# Patient Record
Sex: Female | Born: 1985 | Race: Black or African American | Hispanic: No | Marital: Single | Smoking: Never smoker
Health system: Southern US, Community
[De-identification: ages and names within clinical notes are randomized; demographics above are authoritative.]

## PROBLEM LIST (undated history)

## (undated) DIAGNOSIS — L309 Dermatitis, unspecified: Secondary | ICD-10-CM

## (undated) HISTORY — DX: Dermatitis, unspecified: L30.9

---

## 2016-09-01 ENCOUNTER — Other Ambulatory Visit: Payer: Self-pay | Admitting: Physician Assistant

## 2016-09-01 DIAGNOSIS — N946 Dysmenorrhea, unspecified: Secondary | ICD-10-CM

## 2016-09-01 DIAGNOSIS — R630 Anorexia: Secondary | ICD-10-CM

## 2016-09-05 ENCOUNTER — Other Ambulatory Visit: Payer: Self-pay

## 2016-09-08 ENCOUNTER — Ambulatory Visit
Admission: RE | Admit: 2016-09-08 | Discharge: 2016-09-08 | Disposition: A | Discharge status: Home / Self Care | Payer: Medicaid Other | Source: Ambulatory Visit | Attending: Physician Assistant | Admitting: Physician Assistant

## 2016-09-08 DIAGNOSIS — R630 Anorexia: Secondary | ICD-10-CM

## 2016-09-08 DIAGNOSIS — N946 Dysmenorrhea, unspecified: Secondary | ICD-10-CM

## 2016-09-25 DIAGNOSIS — K219 Gastro-esophageal reflux disease without esophagitis: Secondary | ICD-10-CM | POA: Diagnosis not present

## 2016-09-25 DIAGNOSIS — J3089 Other allergic rhinitis: Secondary | ICD-10-CM | POA: Diagnosis not present

## 2016-09-25 DIAGNOSIS — E785 Hyperlipidemia, unspecified: Secondary | ICD-10-CM | POA: Diagnosis not present

## 2016-09-25 DIAGNOSIS — R1013 Epigastric pain: Secondary | ICD-10-CM | POA: Diagnosis not present

## 2016-09-25 DIAGNOSIS — E559 Vitamin D deficiency, unspecified: Secondary | ICD-10-CM | POA: Diagnosis not present

## 2016-09-25 DIAGNOSIS — I10 Essential (primary) hypertension: Secondary | ICD-10-CM | POA: Diagnosis not present

## 2016-09-25 DIAGNOSIS — N946 Dysmenorrhea, unspecified: Secondary | ICD-10-CM | POA: Diagnosis not present

## 2016-10-01 DIAGNOSIS — R110 Nausea: Secondary | ICD-10-CM | POA: Diagnosis not present

## 2016-10-01 DIAGNOSIS — R51 Headache: Secondary | ICD-10-CM | POA: Diagnosis not present

## 2016-10-01 DIAGNOSIS — Z30011 Encounter for initial prescription of contraceptive pills: Secondary | ICD-10-CM | POA: Diagnosis not present

## 2016-10-02 DIAGNOSIS — L298 Other pruritus: Secondary | ICD-10-CM | POA: Diagnosis not present

## 2016-11-20 DIAGNOSIS — J3089 Other allergic rhinitis: Secondary | ICD-10-CM | POA: Diagnosis not present

## 2016-11-20 DIAGNOSIS — K219 Gastro-esophageal reflux disease without esophagitis: Secondary | ICD-10-CM | POA: Diagnosis not present

## 2016-11-20 DIAGNOSIS — E559 Vitamin D deficiency, unspecified: Secondary | ICD-10-CM | POA: Diagnosis not present

## 2016-11-20 DIAGNOSIS — I10 Essential (primary) hypertension: Secondary | ICD-10-CM | POA: Diagnosis not present

## 2016-11-20 DIAGNOSIS — E785 Hyperlipidemia, unspecified: Secondary | ICD-10-CM | POA: Diagnosis not present

## 2016-11-20 DIAGNOSIS — R1013 Epigastric pain: Secondary | ICD-10-CM | POA: Diagnosis not present

## 2016-11-20 DIAGNOSIS — N946 Dysmenorrhea, unspecified: Secondary | ICD-10-CM | POA: Diagnosis not present

## 2016-11-20 DIAGNOSIS — Z124 Encounter for screening for malignant neoplasm of cervix: Secondary | ICD-10-CM | POA: Diagnosis not present

## 2017-01-13 DIAGNOSIS — R3989 Other symptoms and signs involving the genitourinary system: Secondary | ICD-10-CM | POA: Diagnosis not present

## 2017-01-13 DIAGNOSIS — N760 Acute vaginitis: Secondary | ICD-10-CM | POA: Diagnosis not present

## 2017-01-13 DIAGNOSIS — B9689 Other specified bacterial agents as the cause of diseases classified elsewhere: Secondary | ICD-10-CM | POA: Diagnosis not present

## 2017-07-30 DIAGNOSIS — N898 Other specified noninflammatory disorders of vagina: Secondary | ICD-10-CM | POA: Diagnosis not present

## 2017-07-30 DIAGNOSIS — N760 Acute vaginitis: Secondary | ICD-10-CM | POA: Diagnosis not present

## 2017-07-30 DIAGNOSIS — B9689 Other specified bacterial agents as the cause of diseases classified elsewhere: Secondary | ICD-10-CM | POA: Diagnosis not present

## 2017-08-18 DIAGNOSIS — R1013 Epigastric pain: Secondary | ICD-10-CM | POA: Diagnosis not present

## 2017-08-18 DIAGNOSIS — E785 Hyperlipidemia, unspecified: Secondary | ICD-10-CM | POA: Diagnosis not present

## 2017-08-18 DIAGNOSIS — I10 Essential (primary) hypertension: Secondary | ICD-10-CM | POA: Diagnosis not present

## 2017-08-18 DIAGNOSIS — E559 Vitamin D deficiency, unspecified: Secondary | ICD-10-CM | POA: Diagnosis not present

## 2017-08-18 DIAGNOSIS — N946 Dysmenorrhea, unspecified: Secondary | ICD-10-CM | POA: Diagnosis not present

## 2017-08-18 DIAGNOSIS — N760 Acute vaginitis: Secondary | ICD-10-CM | POA: Diagnosis not present

## 2017-08-18 DIAGNOSIS — K219 Gastro-esophageal reflux disease without esophagitis: Secondary | ICD-10-CM | POA: Diagnosis not present

## 2017-08-18 DIAGNOSIS — J3089 Other allergic rhinitis: Secondary | ICD-10-CM | POA: Diagnosis not present

## 2017-08-18 DIAGNOSIS — Z0000 Encounter for general adult medical examination without abnormal findings: Secondary | ICD-10-CM | POA: Diagnosis not present

## 2017-10-08 DIAGNOSIS — E785 Hyperlipidemia, unspecified: Secondary | ICD-10-CM | POA: Diagnosis not present

## 2017-10-08 DIAGNOSIS — M25471 Effusion, right ankle: Secondary | ICD-10-CM | POA: Diagnosis not present

## 2017-10-08 DIAGNOSIS — N946 Dysmenorrhea, unspecified: Secondary | ICD-10-CM | POA: Diagnosis not present

## 2017-10-08 DIAGNOSIS — Z131 Encounter for screening for diabetes mellitus: Secondary | ICD-10-CM | POA: Diagnosis not present

## 2017-10-08 DIAGNOSIS — N760 Acute vaginitis: Secondary | ICD-10-CM | POA: Diagnosis not present

## 2017-10-08 DIAGNOSIS — K219 Gastro-esophageal reflux disease without esophagitis: Secondary | ICD-10-CM | POA: Diagnosis not present

## 2017-10-08 DIAGNOSIS — R1013 Epigastric pain: Secondary | ICD-10-CM | POA: Diagnosis not present

## 2017-10-08 DIAGNOSIS — J3089 Other allergic rhinitis: Secondary | ICD-10-CM | POA: Diagnosis not present

## 2017-10-08 DIAGNOSIS — I10 Essential (primary) hypertension: Secondary | ICD-10-CM | POA: Diagnosis not present

## 2017-10-08 DIAGNOSIS — E559 Vitamin D deficiency, unspecified: Secondary | ICD-10-CM | POA: Diagnosis not present

## 2017-10-19 DIAGNOSIS — M2142 Flat foot [pes planus] (acquired), left foot: Secondary | ICD-10-CM | POA: Diagnosis not present

## 2017-10-19 DIAGNOSIS — M2141 Flat foot [pes planus] (acquired), right foot: Secondary | ICD-10-CM | POA: Diagnosis not present

## 2017-10-26 DIAGNOSIS — M419 Scoliosis, unspecified: Secondary | ICD-10-CM | POA: Diagnosis not present

## 2017-11-03 DIAGNOSIS — N898 Other specified noninflammatory disorders of vagina: Secondary | ICD-10-CM | POA: Diagnosis not present

## 2017-11-03 DIAGNOSIS — E785 Hyperlipidemia, unspecified: Secondary | ICD-10-CM | POA: Diagnosis not present

## 2017-11-03 DIAGNOSIS — I10 Essential (primary) hypertension: Secondary | ICD-10-CM | POA: Diagnosis not present

## 2017-11-03 DIAGNOSIS — N946 Dysmenorrhea, unspecified: Secondary | ICD-10-CM | POA: Diagnosis not present

## 2017-11-03 DIAGNOSIS — K219 Gastro-esophageal reflux disease without esophagitis: Secondary | ICD-10-CM | POA: Diagnosis not present

## 2017-11-03 DIAGNOSIS — J3089 Other allergic rhinitis: Secondary | ICD-10-CM | POA: Diagnosis not present

## 2017-11-03 DIAGNOSIS — E559 Vitamin D deficiency, unspecified: Secondary | ICD-10-CM | POA: Diagnosis not present

## 2017-11-03 DIAGNOSIS — R1013 Epigastric pain: Secondary | ICD-10-CM | POA: Diagnosis not present

## 2017-12-28 DIAGNOSIS — Z01118 Encounter for examination of ears and hearing with other abnormal findings: Secondary | ICD-10-CM | POA: Diagnosis not present

## 2017-12-28 DIAGNOSIS — I10 Essential (primary) hypertension: Secondary | ICD-10-CM | POA: Diagnosis not present

## 2017-12-28 DIAGNOSIS — R1013 Epigastric pain: Secondary | ICD-10-CM | POA: Diagnosis not present

## 2017-12-28 DIAGNOSIS — E785 Hyperlipidemia, unspecified: Secondary | ICD-10-CM | POA: Diagnosis not present

## 2017-12-28 DIAGNOSIS — K219 Gastro-esophageal reflux disease without esophagitis: Secondary | ICD-10-CM | POA: Diagnosis not present

## 2017-12-28 DIAGNOSIS — N946 Dysmenorrhea, unspecified: Secondary | ICD-10-CM | POA: Diagnosis not present

## 2017-12-28 DIAGNOSIS — J3089 Other allergic rhinitis: Secondary | ICD-10-CM | POA: Diagnosis not present

## 2017-12-28 DIAGNOSIS — Z136 Encounter for screening for cardiovascular disorders: Secondary | ICD-10-CM | POA: Diagnosis not present

## 2017-12-28 DIAGNOSIS — J029 Acute pharyngitis, unspecified: Secondary | ICD-10-CM | POA: Diagnosis not present

## 2017-12-28 DIAGNOSIS — Z131 Encounter for screening for diabetes mellitus: Secondary | ICD-10-CM | POA: Diagnosis not present

## 2017-12-28 DIAGNOSIS — Z5181 Encounter for therapeutic drug level monitoring: Secondary | ICD-10-CM | POA: Diagnosis not present

## 2017-12-28 DIAGNOSIS — E559 Vitamin D deficiency, unspecified: Secondary | ICD-10-CM | POA: Diagnosis not present

## 2018-02-09 DIAGNOSIS — N946 Dysmenorrhea, unspecified: Secondary | ICD-10-CM | POA: Diagnosis not present

## 2018-02-09 DIAGNOSIS — I10 Essential (primary) hypertension: Secondary | ICD-10-CM | POA: Diagnosis not present

## 2018-02-09 DIAGNOSIS — T7840XA Allergy, unspecified, initial encounter: Secondary | ICD-10-CM | POA: Diagnosis not present

## 2018-02-09 DIAGNOSIS — K219 Gastro-esophageal reflux disease without esophagitis: Secondary | ICD-10-CM | POA: Diagnosis not present

## 2018-02-09 DIAGNOSIS — E559 Vitamin D deficiency, unspecified: Secondary | ICD-10-CM | POA: Diagnosis not present

## 2018-02-09 DIAGNOSIS — E785 Hyperlipidemia, unspecified: Secondary | ICD-10-CM | POA: Diagnosis not present

## 2018-02-09 DIAGNOSIS — J3089 Other allergic rhinitis: Secondary | ICD-10-CM | POA: Diagnosis not present

## 2018-02-09 DIAGNOSIS — N898 Other specified noninflammatory disorders of vagina: Secondary | ICD-10-CM | POA: Diagnosis not present

## 2018-02-09 DIAGNOSIS — R1013 Epigastric pain: Secondary | ICD-10-CM | POA: Diagnosis not present

## 2018-02-16 DIAGNOSIS — I10 Essential (primary) hypertension: Secondary | ICD-10-CM | POA: Diagnosis not present

## 2018-02-16 DIAGNOSIS — J3089 Other allergic rhinitis: Secondary | ICD-10-CM | POA: Diagnosis not present

## 2018-02-16 DIAGNOSIS — N946 Dysmenorrhea, unspecified: Secondary | ICD-10-CM | POA: Diagnosis not present

## 2018-02-16 DIAGNOSIS — E785 Hyperlipidemia, unspecified: Secondary | ICD-10-CM | POA: Diagnosis not present

## 2018-02-16 DIAGNOSIS — R1013 Epigastric pain: Secondary | ICD-10-CM | POA: Diagnosis not present

## 2018-02-16 DIAGNOSIS — K219 Gastro-esophageal reflux disease without esophagitis: Secondary | ICD-10-CM | POA: Diagnosis not present

## 2018-02-16 DIAGNOSIS — N898 Other specified noninflammatory disorders of vagina: Secondary | ICD-10-CM | POA: Diagnosis not present

## 2018-02-16 DIAGNOSIS — E559 Vitamin D deficiency, unspecified: Secondary | ICD-10-CM | POA: Diagnosis not present

## 2018-07-27 DIAGNOSIS — R1013 Epigastric pain: Secondary | ICD-10-CM | POA: Diagnosis not present

## 2018-07-27 DIAGNOSIS — I10 Essential (primary) hypertension: Secondary | ICD-10-CM | POA: Diagnosis not present

## 2018-07-27 DIAGNOSIS — R300 Dysuria: Secondary | ICD-10-CM | POA: Diagnosis not present

## 2018-07-27 DIAGNOSIS — K219 Gastro-esophageal reflux disease without esophagitis: Secondary | ICD-10-CM | POA: Diagnosis not present

## 2018-07-27 DIAGNOSIS — N898 Other specified noninflammatory disorders of vagina: Secondary | ICD-10-CM | POA: Diagnosis not present

## 2018-07-27 DIAGNOSIS — E559 Vitamin D deficiency, unspecified: Secondary | ICD-10-CM | POA: Diagnosis not present

## 2018-07-27 DIAGNOSIS — J3089 Other allergic rhinitis: Secondary | ICD-10-CM | POA: Diagnosis not present

## 2018-07-27 DIAGNOSIS — E785 Hyperlipidemia, unspecified: Secondary | ICD-10-CM | POA: Diagnosis not present

## 2018-07-27 DIAGNOSIS — N946 Dysmenorrhea, unspecified: Secondary | ICD-10-CM | POA: Diagnosis not present

## 2018-08-05 IMAGING — US US TRANSVAGINAL NON-OB
1 series · 14 of 25 positions shown · non-contrast
Comparison: None

CLINICAL DATA: Menorrhagia.

EXAM:
TRANSABDOMINAL AND TRANSVAGINAL ULTRASOUND OF PELVIS
TECHNIQUE: Both transabdominal and transvaginal ultrasound examinations of the
pelvis were performed. Transabdominal technique was performed for
global imaging of the pelvis including uterus, ovaries, adnexal
regions, and pelvic cul-de-sac. It was necessary to proceed with
endovaginal exam following the transabdominal exam to visualize the
uterus and ovaries.

[Series 1: us transvaginal non-ob · 0.25mm/px · 50 acquisitions, 14 frames shown]
[im 1/50]
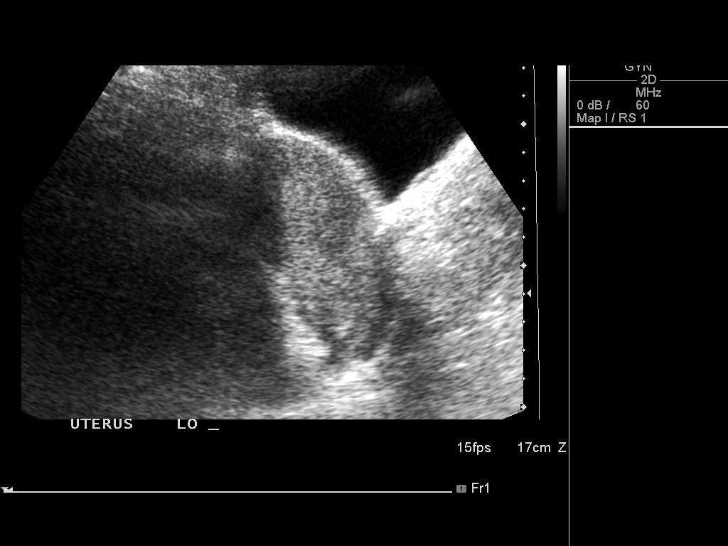
[im 5/50]
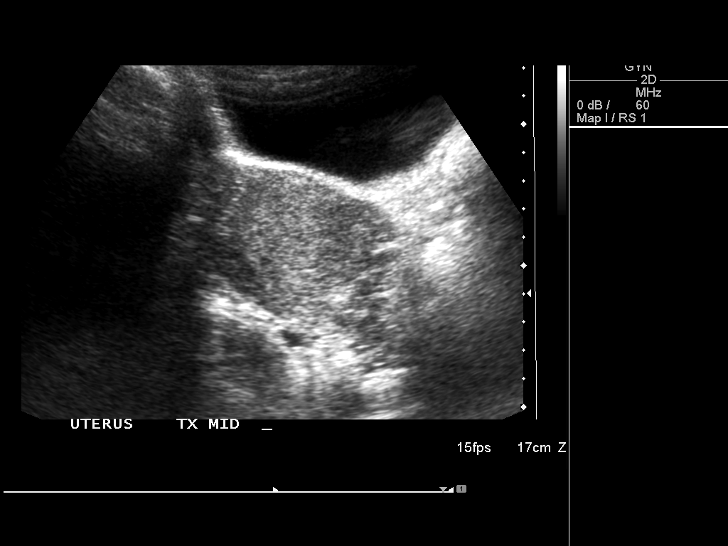
[im 9/50]
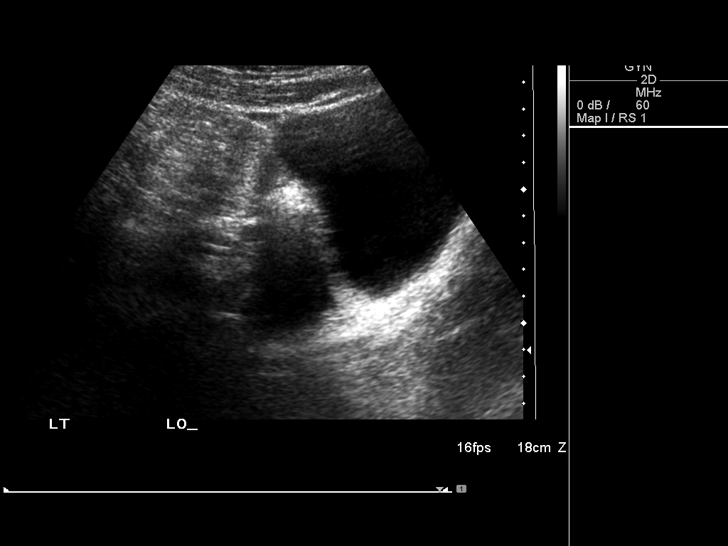
[im 13/50]
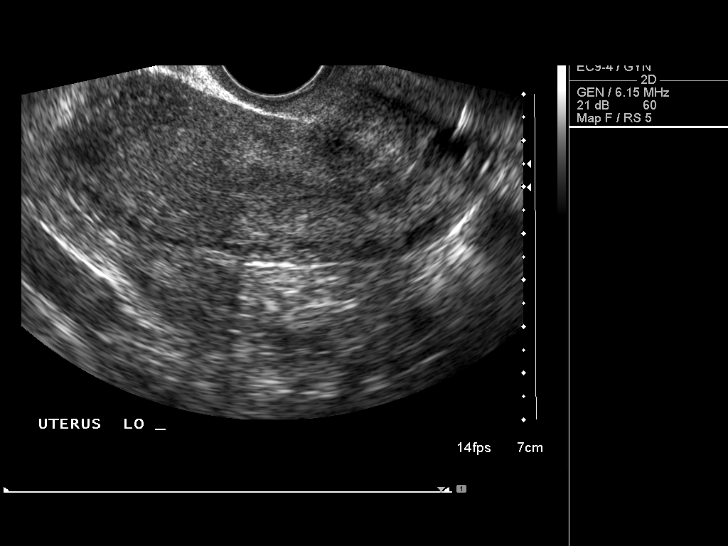
[im 17/50]
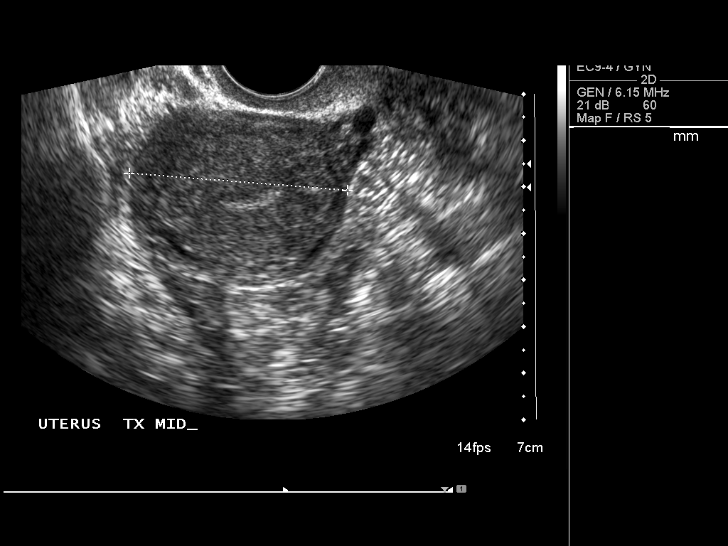
[im 19/50]
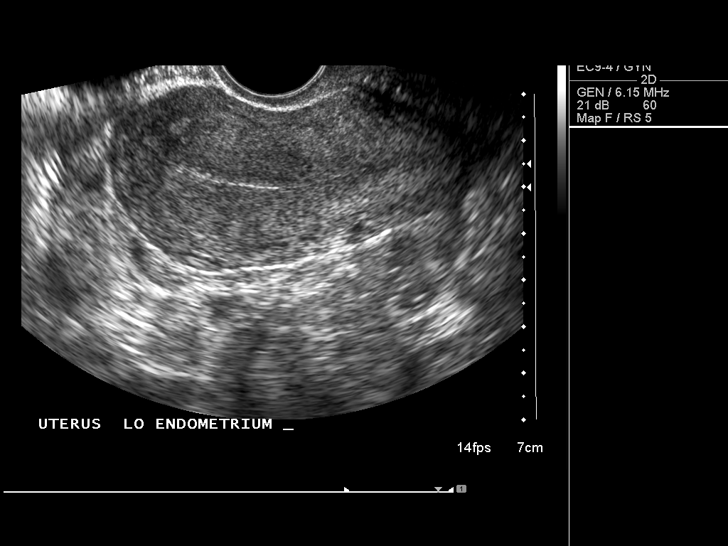
[im 23/50]
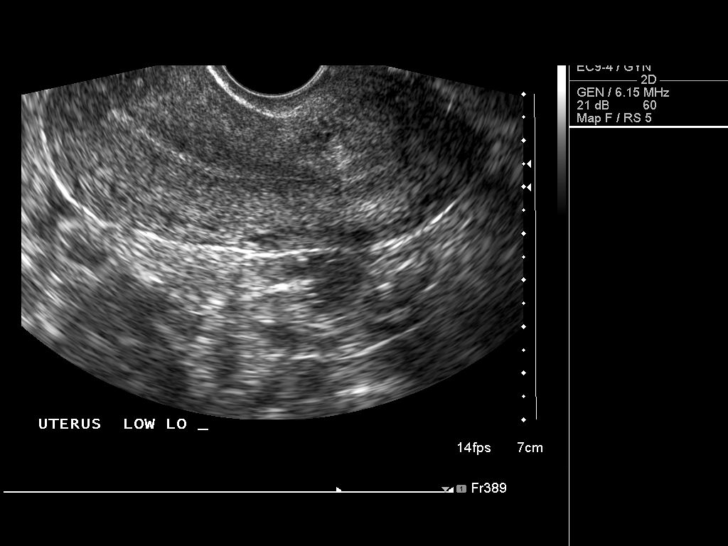
[im 27/50]
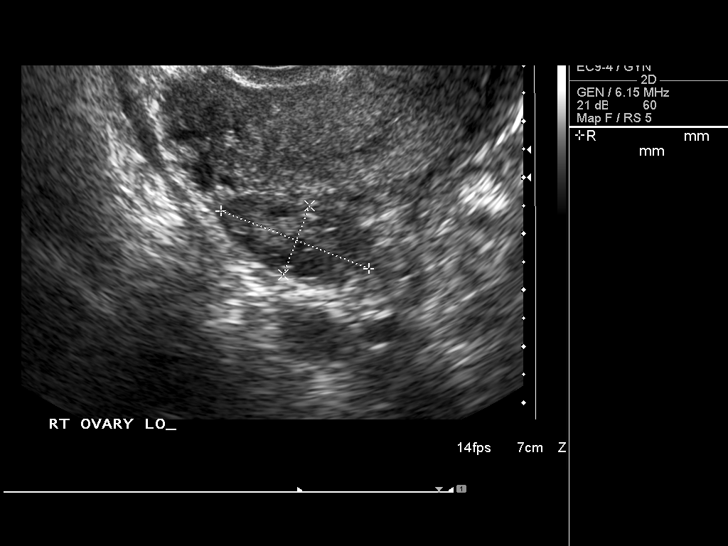
[im 31/50]
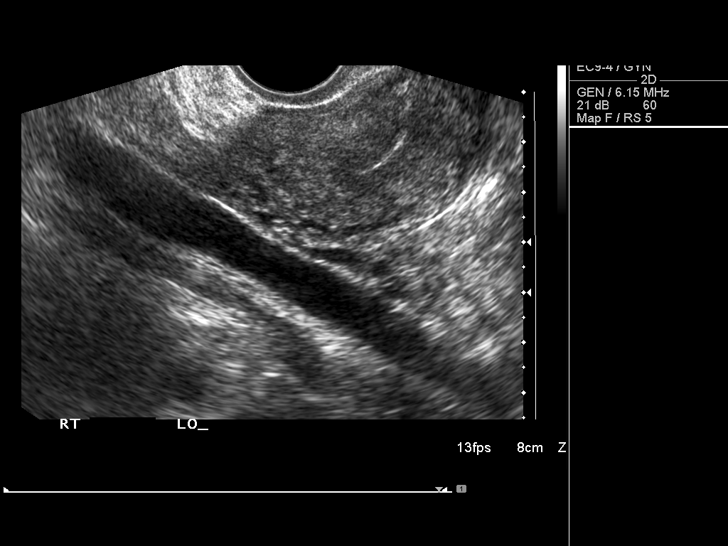
[im 33/50]
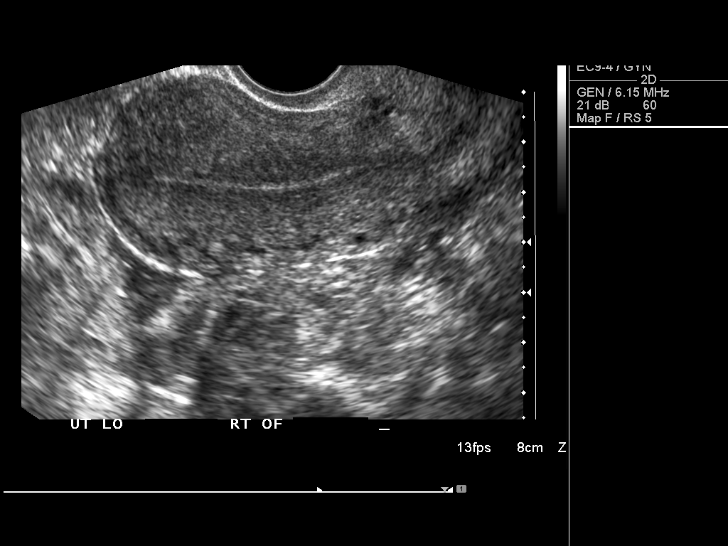
[im 37/50]
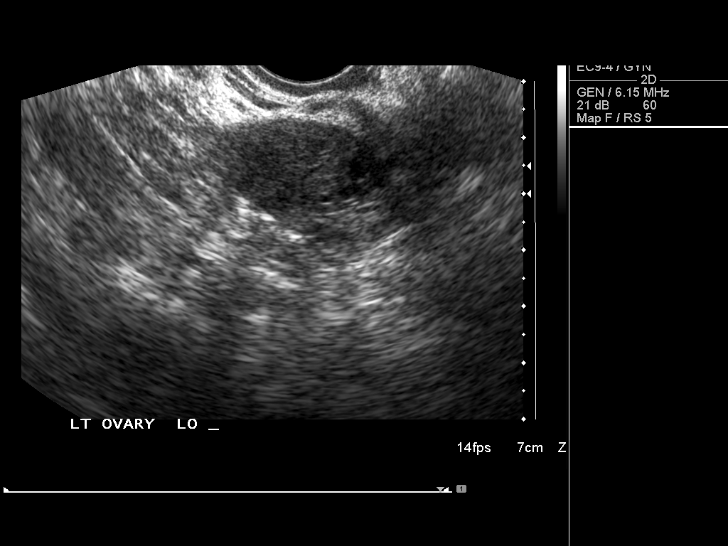
[im 41/50]
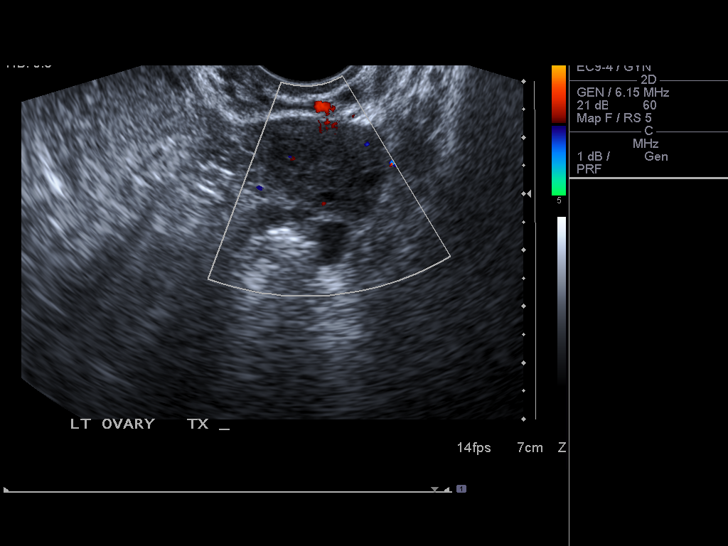
[im 45/50]
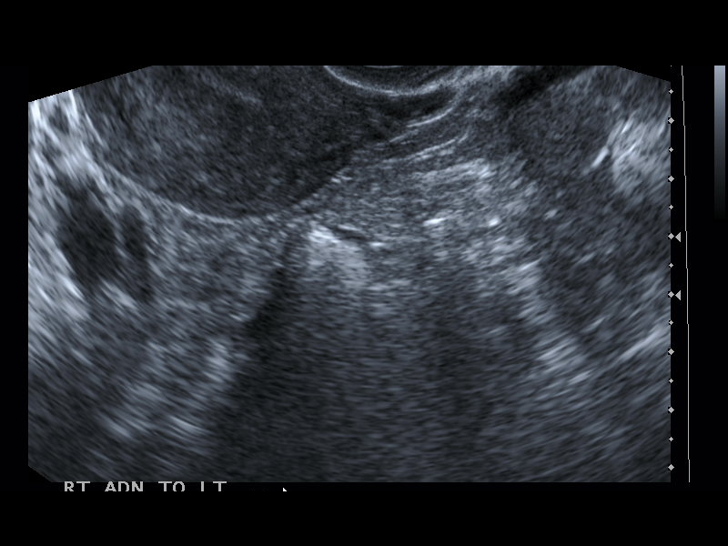
[im 50/50]
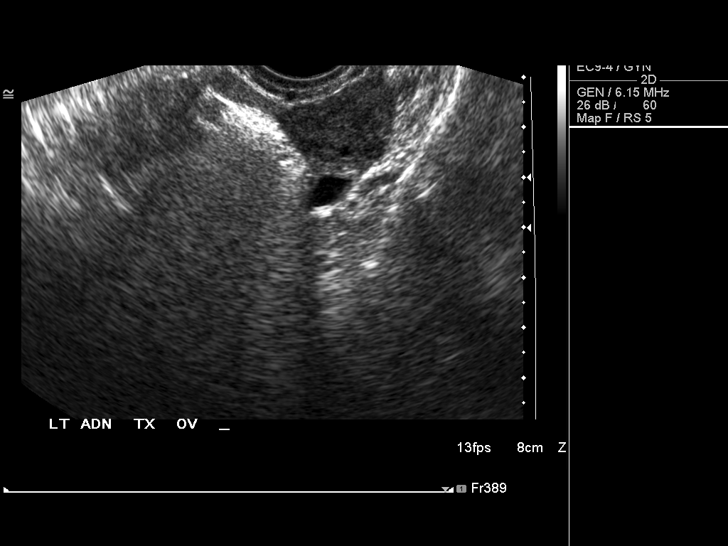

[14 of 25 positions shown; findings below may reference images not displayed]

FINDINGS: Uterus

Measurements: 9.0 x 4.7 x 3.6 cm. No fibroids or other mass
visualized.

Endometrium

Thickness: 3.8 mm which is within normal limits. No focal
abnormality visualized.

Right ovary

Measurements: 2.8 x 2.7 x 1.3 cm. Normal appearance/no adnexal mass.

Left ovary

Measurements: 3.1 x 1.8 x 1.7 cm. Normal appearance/no adnexal mass.

Other findings

No abnormal free fluid.
IMPRESSION: No definite abnormality seen in the pelvis.

## 2018-12-07 DIAGNOSIS — J3089 Other allergic rhinitis: Secondary | ICD-10-CM | POA: Diagnosis not present

## 2018-12-07 DIAGNOSIS — I10 Essential (primary) hypertension: Secondary | ICD-10-CM | POA: Diagnosis not present

## 2018-12-07 DIAGNOSIS — R079 Chest pain, unspecified: Secondary | ICD-10-CM | POA: Diagnosis not present

## 2018-12-07 DIAGNOSIS — K219 Gastro-esophageal reflux disease without esophagitis: Secondary | ICD-10-CM | POA: Diagnosis not present

## 2018-12-07 DIAGNOSIS — N946 Dysmenorrhea, unspecified: Secondary | ICD-10-CM | POA: Diagnosis not present

## 2018-12-07 DIAGNOSIS — N3000 Acute cystitis without hematuria: Secondary | ICD-10-CM | POA: Diagnosis not present

## 2018-12-07 DIAGNOSIS — E559 Vitamin D deficiency, unspecified: Secondary | ICD-10-CM | POA: Diagnosis not present

## 2018-12-07 DIAGNOSIS — E785 Hyperlipidemia, unspecified: Secondary | ICD-10-CM | POA: Diagnosis not present

## 2018-12-07 DIAGNOSIS — R1013 Epigastric pain: Secondary | ICD-10-CM | POA: Diagnosis not present

## 2018-12-21 DIAGNOSIS — R1013 Epigastric pain: Secondary | ICD-10-CM | POA: Diagnosis not present

## 2018-12-21 DIAGNOSIS — I10 Essential (primary) hypertension: Secondary | ICD-10-CM | POA: Diagnosis not present

## 2018-12-21 DIAGNOSIS — J3089 Other allergic rhinitis: Secondary | ICD-10-CM | POA: Diagnosis not present

## 2018-12-21 DIAGNOSIS — E785 Hyperlipidemia, unspecified: Secondary | ICD-10-CM | POA: Diagnosis not present

## 2018-12-21 DIAGNOSIS — K219 Gastro-esophageal reflux disease without esophagitis: Secondary | ICD-10-CM | POA: Diagnosis not present

## 2018-12-21 DIAGNOSIS — N946 Dysmenorrhea, unspecified: Secondary | ICD-10-CM | POA: Diagnosis not present

## 2018-12-21 DIAGNOSIS — J028 Acute pharyngitis due to other specified organisms: Secondary | ICD-10-CM | POA: Diagnosis not present

## 2018-12-21 DIAGNOSIS — R0602 Shortness of breath: Secondary | ICD-10-CM | POA: Diagnosis not present

## 2018-12-21 DIAGNOSIS — E559 Vitamin D deficiency, unspecified: Secondary | ICD-10-CM | POA: Diagnosis not present

## 2018-12-28 DIAGNOSIS — R1013 Epigastric pain: Secondary | ICD-10-CM | POA: Diagnosis not present

## 2018-12-28 DIAGNOSIS — K219 Gastro-esophageal reflux disease without esophagitis: Secondary | ICD-10-CM | POA: Diagnosis not present

## 2018-12-28 DIAGNOSIS — J3089 Other allergic rhinitis: Secondary | ICD-10-CM | POA: Diagnosis not present

## 2018-12-28 DIAGNOSIS — E559 Vitamin D deficiency, unspecified: Secondary | ICD-10-CM | POA: Diagnosis not present

## 2018-12-28 DIAGNOSIS — Z0110 Encounter for examination of ears and hearing without abnormal findings: Secondary | ICD-10-CM | POA: Diagnosis not present

## 2018-12-28 DIAGNOSIS — N946 Dysmenorrhea, unspecified: Secondary | ICD-10-CM | POA: Diagnosis not present

## 2018-12-28 DIAGNOSIS — I10 Essential (primary) hypertension: Secondary | ICD-10-CM | POA: Diagnosis not present

## 2018-12-28 DIAGNOSIS — Z131 Encounter for screening for diabetes mellitus: Secondary | ICD-10-CM | POA: Diagnosis not present

## 2018-12-28 DIAGNOSIS — R0602 Shortness of breath: Secondary | ICD-10-CM | POA: Diagnosis not present

## 2018-12-28 DIAGNOSIS — E785 Hyperlipidemia, unspecified: Secondary | ICD-10-CM | POA: Diagnosis not present

## 2019-01-20 DIAGNOSIS — J3089 Other allergic rhinitis: Secondary | ICD-10-CM | POA: Diagnosis not present

## 2019-01-20 DIAGNOSIS — E785 Hyperlipidemia, unspecified: Secondary | ICD-10-CM | POA: Diagnosis not present

## 2019-01-20 DIAGNOSIS — R1013 Epigastric pain: Secondary | ICD-10-CM | POA: Diagnosis not present

## 2019-01-20 DIAGNOSIS — E559 Vitamin D deficiency, unspecified: Secondary | ICD-10-CM | POA: Diagnosis not present

## 2019-01-20 DIAGNOSIS — I10 Essential (primary) hypertension: Secondary | ICD-10-CM | POA: Diagnosis not present

## 2019-01-20 DIAGNOSIS — K219 Gastro-esophageal reflux disease without esophagitis: Secondary | ICD-10-CM | POA: Diagnosis not present

## 2019-01-20 DIAGNOSIS — N946 Dysmenorrhea, unspecified: Secondary | ICD-10-CM | POA: Diagnosis not present

## 2019-06-30 DIAGNOSIS — N946 Dysmenorrhea, unspecified: Secondary | ICD-10-CM | POA: Diagnosis not present

## 2019-06-30 DIAGNOSIS — J3089 Other allergic rhinitis: Secondary | ICD-10-CM | POA: Diagnosis not present

## 2019-06-30 DIAGNOSIS — I10 Essential (primary) hypertension: Secondary | ICD-10-CM | POA: Diagnosis not present

## 2019-06-30 DIAGNOSIS — K219 Gastro-esophageal reflux disease without esophagitis: Secondary | ICD-10-CM | POA: Diagnosis not present

## 2019-06-30 DIAGNOSIS — R1013 Epigastric pain: Secondary | ICD-10-CM | POA: Diagnosis not present

## 2019-06-30 DIAGNOSIS — E559 Vitamin D deficiency, unspecified: Secondary | ICD-10-CM | POA: Diagnosis not present

## 2019-06-30 DIAGNOSIS — E785 Hyperlipidemia, unspecified: Secondary | ICD-10-CM | POA: Diagnosis not present

## 2019-07-12 DIAGNOSIS — J3089 Other allergic rhinitis: Secondary | ICD-10-CM | POA: Diagnosis not present

## 2019-07-12 DIAGNOSIS — R1013 Epigastric pain: Secondary | ICD-10-CM | POA: Diagnosis not present

## 2019-07-12 DIAGNOSIS — L710 Perioral dermatitis: Secondary | ICD-10-CM | POA: Diagnosis not present

## 2019-07-12 DIAGNOSIS — K219 Gastro-esophageal reflux disease without esophagitis: Secondary | ICD-10-CM | POA: Diagnosis not present

## 2019-07-12 DIAGNOSIS — I10 Essential (primary) hypertension: Secondary | ICD-10-CM | POA: Diagnosis not present

## 2019-07-12 DIAGNOSIS — N946 Dysmenorrhea, unspecified: Secondary | ICD-10-CM | POA: Diagnosis not present

## 2019-07-12 DIAGNOSIS — E559 Vitamin D deficiency, unspecified: Secondary | ICD-10-CM | POA: Diagnosis not present

## 2019-07-12 DIAGNOSIS — E785 Hyperlipidemia, unspecified: Secondary | ICD-10-CM | POA: Diagnosis not present

## 2019-07-28 DIAGNOSIS — E785 Hyperlipidemia, unspecified: Secondary | ICD-10-CM | POA: Diagnosis not present

## 2019-07-28 DIAGNOSIS — N946 Dysmenorrhea, unspecified: Secondary | ICD-10-CM | POA: Diagnosis not present

## 2019-07-28 DIAGNOSIS — E559 Vitamin D deficiency, unspecified: Secondary | ICD-10-CM | POA: Diagnosis not present

## 2019-07-28 DIAGNOSIS — I10 Essential (primary) hypertension: Secondary | ICD-10-CM | POA: Diagnosis not present

## 2019-07-28 DIAGNOSIS — K219 Gastro-esophageal reflux disease without esophagitis: Secondary | ICD-10-CM | POA: Diagnosis not present

## 2019-07-28 DIAGNOSIS — R1013 Epigastric pain: Secondary | ICD-10-CM | POA: Diagnosis not present

## 2019-07-28 DIAGNOSIS — J3089 Other allergic rhinitis: Secondary | ICD-10-CM | POA: Diagnosis not present

## 2019-09-01 DIAGNOSIS — I10 Essential (primary) hypertension: Secondary | ICD-10-CM | POA: Diagnosis not present

## 2019-09-01 DIAGNOSIS — M25511 Pain in right shoulder: Secondary | ICD-10-CM | POA: Diagnosis not present

## 2019-09-01 DIAGNOSIS — R079 Chest pain, unspecified: Secondary | ICD-10-CM | POA: Diagnosis not present

## 2019-09-01 DIAGNOSIS — E785 Hyperlipidemia, unspecified: Secondary | ICD-10-CM | POA: Diagnosis not present

## 2019-09-01 DIAGNOSIS — N946 Dysmenorrhea, unspecified: Secondary | ICD-10-CM | POA: Diagnosis not present

## 2019-09-01 DIAGNOSIS — J3089 Other allergic rhinitis: Secondary | ICD-10-CM | POA: Diagnosis not present

## 2019-09-01 DIAGNOSIS — E559 Vitamin D deficiency, unspecified: Secondary | ICD-10-CM | POA: Diagnosis not present

## 2019-09-01 DIAGNOSIS — R1013 Epigastric pain: Secondary | ICD-10-CM | POA: Diagnosis not present

## 2019-09-01 DIAGNOSIS — K219 Gastro-esophageal reflux disease without esophagitis: Secondary | ICD-10-CM | POA: Diagnosis not present

## 2019-10-04 DIAGNOSIS — E785 Hyperlipidemia, unspecified: Secondary | ICD-10-CM | POA: Diagnosis not present

## 2019-10-04 DIAGNOSIS — I10 Essential (primary) hypertension: Secondary | ICD-10-CM | POA: Diagnosis not present

## 2019-10-04 DIAGNOSIS — R1013 Epigastric pain: Secondary | ICD-10-CM | POA: Diagnosis not present

## 2019-10-04 DIAGNOSIS — K219 Gastro-esophageal reflux disease without esophagitis: Secondary | ICD-10-CM | POA: Diagnosis not present

## 2019-10-04 DIAGNOSIS — E559 Vitamin D deficiency, unspecified: Secondary | ICD-10-CM | POA: Diagnosis not present

## 2019-10-04 DIAGNOSIS — L710 Perioral dermatitis: Secondary | ICD-10-CM | POA: Diagnosis not present

## 2019-10-04 DIAGNOSIS — N898 Other specified noninflammatory disorders of vagina: Secondary | ICD-10-CM | POA: Diagnosis not present

## 2019-10-04 DIAGNOSIS — N946 Dysmenorrhea, unspecified: Secondary | ICD-10-CM | POA: Diagnosis not present

## 2019-10-04 DIAGNOSIS — J3089 Other allergic rhinitis: Secondary | ICD-10-CM | POA: Diagnosis not present

## 2019-10-20 DIAGNOSIS — K219 Gastro-esophageal reflux disease without esophagitis: Secondary | ICD-10-CM | POA: Diagnosis not present

## 2019-10-20 DIAGNOSIS — I10 Essential (primary) hypertension: Secondary | ICD-10-CM | POA: Diagnosis not present

## 2019-10-20 DIAGNOSIS — L710 Perioral dermatitis: Secondary | ICD-10-CM | POA: Diagnosis not present

## 2019-10-20 DIAGNOSIS — E559 Vitamin D deficiency, unspecified: Secondary | ICD-10-CM | POA: Diagnosis not present

## 2019-10-20 DIAGNOSIS — R1013 Epigastric pain: Secondary | ICD-10-CM | POA: Diagnosis not present

## 2019-10-20 DIAGNOSIS — J3089 Other allergic rhinitis: Secondary | ICD-10-CM | POA: Diagnosis not present

## 2019-10-20 DIAGNOSIS — E785 Hyperlipidemia, unspecified: Secondary | ICD-10-CM | POA: Diagnosis not present

## 2019-10-20 DIAGNOSIS — N946 Dysmenorrhea, unspecified: Secondary | ICD-10-CM | POA: Diagnosis not present

## 2019-10-27 DIAGNOSIS — L710 Perioral dermatitis: Secondary | ICD-10-CM | POA: Diagnosis not present

## 2019-11-29 DIAGNOSIS — I10 Essential (primary) hypertension: Secondary | ICD-10-CM | POA: Diagnosis not present

## 2019-11-29 DIAGNOSIS — E785 Hyperlipidemia, unspecified: Secondary | ICD-10-CM | POA: Diagnosis not present

## 2019-11-29 DIAGNOSIS — L710 Perioral dermatitis: Secondary | ICD-10-CM | POA: Diagnosis not present

## 2019-11-29 DIAGNOSIS — E559 Vitamin D deficiency, unspecified: Secondary | ICD-10-CM | POA: Diagnosis not present

## 2019-11-29 DIAGNOSIS — J3089 Other allergic rhinitis: Secondary | ICD-10-CM | POA: Diagnosis not present

## 2019-11-29 DIAGNOSIS — K219 Gastro-esophageal reflux disease without esophagitis: Secondary | ICD-10-CM | POA: Diagnosis not present

## 2019-11-29 DIAGNOSIS — R1013 Epigastric pain: Secondary | ICD-10-CM | POA: Diagnosis not present

## 2019-11-29 DIAGNOSIS — Z124 Encounter for screening for malignant neoplasm of cervix: Secondary | ICD-10-CM | POA: Diagnosis not present

## 2019-11-29 DIAGNOSIS — Z113 Encounter for screening for infections with a predominantly sexual mode of transmission: Secondary | ICD-10-CM | POA: Diagnosis not present

## 2019-11-29 DIAGNOSIS — N946 Dysmenorrhea, unspecified: Secondary | ICD-10-CM | POA: Diagnosis not present

## 2020-01-12 DIAGNOSIS — L710 Perioral dermatitis: Secondary | ICD-10-CM | POA: Diagnosis not present

## 2020-01-12 DIAGNOSIS — L811 Chloasma: Secondary | ICD-10-CM | POA: Diagnosis not present

## 2021-09-05 DIAGNOSIS — E559 Vitamin D deficiency, unspecified: Secondary | ICD-10-CM | POA: Diagnosis not present

## 2021-09-05 DIAGNOSIS — K219 Gastro-esophageal reflux disease without esophagitis: Secondary | ICD-10-CM | POA: Diagnosis not present

## 2021-09-05 DIAGNOSIS — N946 Dysmenorrhea, unspecified: Secondary | ICD-10-CM | POA: Diagnosis not present

## 2021-09-05 DIAGNOSIS — I10 Essential (primary) hypertension: Secondary | ICD-10-CM | POA: Diagnosis not present

## 2021-09-05 DIAGNOSIS — E782 Mixed hyperlipidemia: Secondary | ICD-10-CM | POA: Diagnosis not present

## 2021-09-25 DIAGNOSIS — I10 Essential (primary) hypertension: Secondary | ICD-10-CM | POA: Diagnosis not present

## 2021-09-25 DIAGNOSIS — Z124 Encounter for screening for malignant neoplasm of cervix: Secondary | ICD-10-CM | POA: Diagnosis not present

## 2021-09-25 DIAGNOSIS — K219 Gastro-esophageal reflux disease without esophagitis: Secondary | ICD-10-CM | POA: Diagnosis not present

## 2021-09-25 DIAGNOSIS — N760 Acute vaginitis: Secondary | ICD-10-CM | POA: Diagnosis not present

## 2021-09-25 DIAGNOSIS — E782 Mixed hyperlipidemia: Secondary | ICD-10-CM | POA: Diagnosis not present

## 2021-09-25 DIAGNOSIS — N946 Dysmenorrhea, unspecified: Secondary | ICD-10-CM | POA: Diagnosis not present

## 2021-09-25 DIAGNOSIS — E559 Vitamin D deficiency, unspecified: Secondary | ICD-10-CM | POA: Diagnosis not present

## 2021-11-20 DIAGNOSIS — K219 Gastro-esophageal reflux disease without esophagitis: Secondary | ICD-10-CM | POA: Diagnosis not present

## 2021-11-20 DIAGNOSIS — E782 Mixed hyperlipidemia: Secondary | ICD-10-CM | POA: Diagnosis not present

## 2021-11-20 DIAGNOSIS — N946 Dysmenorrhea, unspecified: Secondary | ICD-10-CM | POA: Diagnosis not present

## 2021-11-20 DIAGNOSIS — E559 Vitamin D deficiency, unspecified: Secondary | ICD-10-CM | POA: Diagnosis not present

## 2021-11-20 DIAGNOSIS — I10 Essential (primary) hypertension: Secondary | ICD-10-CM | POA: Diagnosis not present

## 2021-11-20 DIAGNOSIS — Z0001 Encounter for general adult medical examination with abnormal findings: Secondary | ICD-10-CM | POA: Diagnosis not present

## 2022-01-16 DIAGNOSIS — R21 Rash and other nonspecific skin eruption: Secondary | ICD-10-CM | POA: Diagnosis not present

## 2022-02-05 DIAGNOSIS — N898 Other specified noninflammatory disorders of vagina: Secondary | ICD-10-CM | POA: Diagnosis not present

## 2022-02-05 DIAGNOSIS — L710 Perioral dermatitis: Secondary | ICD-10-CM | POA: Diagnosis not present

## 2022-02-05 DIAGNOSIS — K219 Gastro-esophageal reflux disease without esophagitis: Secondary | ICD-10-CM | POA: Diagnosis not present

## 2022-02-05 DIAGNOSIS — E782 Mixed hyperlipidemia: Secondary | ICD-10-CM | POA: Diagnosis not present

## 2022-02-05 DIAGNOSIS — E559 Vitamin D deficiency, unspecified: Secondary | ICD-10-CM | POA: Diagnosis not present

## 2022-02-05 DIAGNOSIS — I10 Essential (primary) hypertension: Secondary | ICD-10-CM | POA: Diagnosis not present

## 2022-02-05 DIAGNOSIS — N946 Dysmenorrhea, unspecified: Secondary | ICD-10-CM | POA: Diagnosis not present

## 2022-03-12 ENCOUNTER — Ambulatory Visit (INDEPENDENT_AMBULATORY_CARE_PROVIDER_SITE_OTHER): Payer: Medicare Other | Admitting: Allergy

## 2022-03-12 ENCOUNTER — Telehealth: Payer: Self-pay

## 2022-03-12 ENCOUNTER — Encounter: Payer: Self-pay | Admitting: Allergy

## 2022-03-12 VITALS — BP 108/78 | HR 87 | Temp 98.7°F | Resp 18 | Ht 70.0 in | Wt 225.0 lb

## 2022-03-12 DIAGNOSIS — T781XXD Other adverse food reactions, not elsewhere classified, subsequent encounter: Secondary | ICD-10-CM

## 2022-03-12 DIAGNOSIS — J310 Chronic rhinitis: Secondary | ICD-10-CM | POA: Diagnosis not present

## 2022-03-12 DIAGNOSIS — H1013 Acute atopic conjunctivitis, bilateral: Secondary | ICD-10-CM | POA: Diagnosis not present

## 2022-03-12 DIAGNOSIS — L2089 Other atopic dermatitis: Secondary | ICD-10-CM | POA: Diagnosis not present

## 2022-03-12 DIAGNOSIS — L309 Dermatitis, unspecified: Secondary | ICD-10-CM | POA: Diagnosis not present

## 2022-03-12 MED ORDER — CETIRIZINE HCL 10 MG PO TABS: 10.0000 mg | ORAL_TABLET | Freq: Every day | ORAL | 5 refills | Status: AC | PRN

## 2022-03-12 MED ORDER — PIMECROLIMUS 1 % EX CREA: TOPICAL_CREAM | Freq: Two times a day (BID) | CUTANEOUS | 2 refills | Status: AC

## 2022-03-12 MED ORDER — FLUTICASONE PROPIONATE 50 MCG/ACT NA SUSP: 2.0000 | Freq: Every day | NASAL | 5 refills | Status: DC | PRN

## 2022-03-12 MED ORDER — OLOPATADINE HCL 0.2 % OP SOLN: 1.0000 [drp] | Freq: Every day | OPHTHALMIC | 5 refills | Status: AC | PRN

## 2022-03-12 MED ORDER — METHYLPREDNISOLONE ACETATE 40 MG/ML IJ SUSP
40.0000 mg | Freq: Once | INTRAMUSCULAR | Status: AC
  Administered 2022-03-12: 40 mg via INTRAMUSCULAR

## 2022-03-12 NOTE — Telephone Encounter (Signed)
Pa submitted thru cover my meds for pimecrolimus cream key [id number]

## 2022-03-12 NOTE — Patient Instructions (Addendum)
Adverse food reaction - concern for food reaction related to cinnamon ingestion - you also note symptoms following ingestion of spicy peppers - continue to avoid cinnamon and spicy peppers - will obtain IgE levels for cinnamon and spicy peppers - if food testing is positive then will provide you with an epinephrine device to have on hand in case of allergic reaction - for continued rash on face provided with depomedrol steroid injection in-office.  Counseled on benefits and risks of steroid injections today.   - recommend use of Elidel, non-steroid ointment, on areas of face that are itchy/red/dry/patchy/bumpy/scaly/flaky twice a day as needed   Environmental allergy - will obtain environmental allergy panel - continue Zyrtec 10mg  daily as needed - for itchy/watery eyes can use Olopatadine eye drop 1 drop each eye daily as needed - for nasal congestin/drainage can use Flonase 2 sprays each nostril daily for 1-2 weeks at a time before stopping once nasal congestion improves for maximum benefit  Eczema - continue daily moisturization and moisturize after bathing   Follow-up in 4-6 months or sooner if needed

## 2022-03-12 NOTE — Progress Notes (Signed)
New Patient Note  RE: Courtney Harris MRN: [id number] DOB: 1986/07/02 Date of Office Visit: 03/12/2022   Primary care provider: Jackie Plum, MD  Chief Complaint: ?cinnamon allergy  History of present illness: Courtney Harris is a 36 y.o. female presenting today for evaluation of food allergy and environmental allergy.    She states about 1.76mo ago she made some sweet potatoes made with cinnamon and several days later she developed a rash.  The rash occurred around her mouth/under lip.  The rash was red, blotchy flat rash that is itchy.  She states her lips were really chapped after this occurred.  She states the rash has not improved and feels it has gotten worse.  With this she denies any GI, respiratory or CV related symptoms.    The first time she noted this issue she states was last year eating sting potatoes cinnamon.  She no longer eats sweets potatoes with cinnamon but she does continue to eat sweet potatoes with butter and sugar without issue. She has noted other foods containing cinnamon also causes her rash and that includes cinnamon buns.  She states every time she has had cinnamon buns she develops a rash. She does not eat cereal like cinnamon toast crunch or [place] toast. Spicy peppery foods also cause similar symptoms.  Thus she avoids peppers like jalapeno, cayenne, chili peppers.  Black pepper does not cause any issue.  She did go to a dermatologist for the rash above that she continues to have and states she was prescribed hydrocortisone cream which has not helped. She then went to her PCP in regards to this rash and was prescribed metrocream which also has not helped.   She would like something to help with this ongoing rash that she does not like to feel or the appearance of the rash.  She reports itchy/watery, runny nose, scratchy throat, sneezing, sometimes cough that are worse in the spring.  She will take zyrtec daily during spring that is helpful.  Will use  flonase if symtpoms are 'bad'.  No eye drop use.    No history of asthma.  She states she was told she has eczema that can flare on arms and face and back.   She does use sensitive skin soaps and lotions.    Review of systems: Review of Systems  Constitutional: Negative.   HENT:  Positive for congestion and sneezing.   Eyes:  Positive for itching.  Respiratory: Negative.    Cardiovascular: Negative.   Gastrointestinal: Negative.   Musculoskeletal: Negative.   Skin:  Positive for rash.  Allergic/Immunologic: Negative.   Neurological: Negative.     All other systems negative unless noted above in HPI  Past medical history: Past Medical History:  Diagnosis Date   Eczema     Past surgical history: History reviewed. No pertinent surgical history.  Family history:  Family History  Problem Relation Age of Onset   Urticaria Mother    Eczema Mother    Allergic rhinitis Mother    Asthma Maternal Aunt     Social history: Lives in a home without carpeting with gas heating and central cooling.  No pets in the home.  No concern for water damage, mildew or roaches in the home.  She is a stay at home mom.  She denies a smoking history.     Medication List: Current Outpatient Medications  Medication Sig Dispense Refill   hydrochlorothiazide (HYDRODIURIL) 12.5 MG tablet Take 12.5 mg by mouth daily.  metroNIDAZOLE (METROCREAM) 0.75 % cream Apply topically 2 (two) times daily.     norethindrone-ethinyl estradiol-FE (JUNEL FE 1/20) 1-20 MG-MCG tablet Take 1 tablet by mouth daily.     norethindrone-ethinyl estradiol-iron (BLISOVI FE 1.5/30) 1.5-30 MG-MCG tablet TK 1 T PO QD     omeprazole (PRILOSEC) 20 MG capsule Take 20 mg by mouth daily.     Current Facility-Administered Medications  Medication Dose Route Frequency Provider Last Rate Last Admin   methylPREDNISolone acetate (DEPO-MEDROL) injection 40 mg  40 mg Intramuscular Once Marcelyn Bruins, MD        Known  medication allergies: No Known Allergies   Physical examination: Blood pressure 108/78, pulse 87, temperature 98.7 F (37.1 C), temperature source Temporal, resp. rate 18, height 5\' 10"  (1.778 m), weight 225 lb (102.1 kg), SpO2 100 %.  General: Alert, interactive, in no acute distress. HEENT: PERRLA, TMs pearly gray, turbinates non-edematous without discharge, post-pharynx non erythematous. Neck: Supple without lymphadenopathy. Lungs: Clear to auscultation without wheezing, rhonchi or rales. {no increased work of breathing. CV: Normal S1, S2 without murmurs. Abdomen: Nondistended, nontender. Skin: Faintly erythematous patch under the lower lip, upper lip . Extremities:  No clubbing, cyanosis or edema. Neuro:   Grossly intact.  Diagnositics/Labs:  Allergy testing: Skin prick testing discussed today as well as serum IgE testing and patient preferred to do serum IgE testing  Depo-Medrol 40 mg provided today in the gluteus maximus  Assessment and plan:   Adverse food reaction Dermatitis - concern for food reaction related to cinnamon ingestion - you also note symptoms following ingestion of spicy peppers - continue to avoid cinnamon and spicy peppers - will obtain IgE levels for cinnamon and spicy peppers - if food testing is positive then will provide you with an epinephrine device to have on hand in case of allergic reaction - for continued rash on face provided with depomedrol steroid injection in-office.  Counseled on benefits and risks of steroid injections today.   - recommend use of Elidel, non-steroid ointment, on areas of face that are itchy/red/dry/patchy/bumpy/scaly/flaky twice a day as needed   Rhinoconjunctivitis - will obtain environmental allergy panel - continue Zyrtec 10mg  daily as needed - for itchy/watery eyes can use Olopatadine eye drop 1 drop each eye daily as needed - for nasal congestin/drainage can use Flonase 2 sprays each nostril daily for 1-2 weeks at a  time before stopping once nasal congestion improves for maximum benefit  Eczema - continue daily moisturization and moisturize after bathing  Follow-up in 4-6 months or sooner if needed  I appreciate the opportunity to take part in Bristal's care. Please do not hesitate to contact me with questions.  Sincerely,   , MD Allergy/Immunology [facility]

## 2022-03-13 MED ORDER — TACROLIMUS 0.1 % EX OINT: TOPICAL_OINTMENT | Freq: Two times a day (BID) | CUTANEOUS | 0 refills | Status: AC

## 2022-03-13 NOTE — Addendum Note (Signed)
Addended by: Rosana Fret L on: 03/13/2022 04:00 PM   Modules accepted: Orders

## 2022-03-13 NOTE — Telephone Encounter (Signed)
Looks like protopic ointment might be covered advise to change

## 2022-03-13 NOTE — Telephone Encounter (Signed)
Pa was denied pt has to try and fail triamcinolone 0.025%, 0.1%, or 0.5%, or mometasone please advise to change in therapy

## 2022-03-18 LAB — ALLERGENS W/TOTAL IGE AREA 2
Alternaria Alternata IgE: 0.45 kU/L — AB
Aspergillus Fumigatus IgE: <0.1 kU/L
Bermuda Grass IgE: <0.1 kU/L
Cat Dander IgE: <0.1 kU/L
Cedar, Mountain IgE: <0.1 kU/L
Cladosporium Herbarum IgE: <0.1 kU/L
Cockroach, German IgE: <0.1 kU/L
Common Silver Birch IgE: <0.1 kU/L
Cottonwood IgE: <0.1 kU/L
D Farinae IgE: <0.1 kU/L
D Pteronyssinus IgE: <0.1 kU/L
Dog Dander IgE: <0.1 kU/L
Elm, American IgE: <0.1 kU/L
IgE (Immunoglobulin E), Serum: 45 IU/mL (ref 6–495)
Johnson Grass IgE: <0.1 kU/L
Maple/Box Elder IgE: <0.1 kU/L
Mouse Urine IgE: <0.1 kU/L
Oak, White IgE: <0.1 kU/L
Pecan, Hickory IgE: <0.1 kU/L
Penicillium Chrysogen IgE: <0.1 kU/L
Pigweed, Rough IgE: <0.1 kU/L
Ragweed, Short IgE: <0.1 kU/L
Sheep Sorrel IgE Qn: <0.1 kU/L
Timothy Grass IgE: <0.1 kU/L
White Mulberry IgE: <0.1 kU/L

## 2022-03-18 LAB — ALLERGEN,CHILI PEPPER,RF279: F279-IgE Chili Pepper: <0.1 kU/L

## 2022-03-18 LAB — JALAPENO PEPPER, IGE
Class Interpretation: 0
Jalapeno Pepper: <0.35 kU/L (ref <0.35)

## 2022-03-18 LAB — ALLERGEN, CINNAMON, RF220: Allergen Cinnamon IgE: <0.1 kU/L

## 2022-04-16 DIAGNOSIS — L710 Perioral dermatitis: Secondary | ICD-10-CM | POA: Diagnosis not present

## 2022-04-23 ENCOUNTER — Ambulatory Visit (INDEPENDENT_AMBULATORY_CARE_PROVIDER_SITE_OTHER): Payer: Medicare Other | Admitting: Allergy

## 2022-04-23 ENCOUNTER — Encounter: Payer: Self-pay | Admitting: Allergy

## 2022-04-23 VITALS — BP 128/70 | HR 73 | Temp 98.1°F | Resp 18

## 2022-04-23 DIAGNOSIS — L2089 Other atopic dermatitis: Secondary | ICD-10-CM

## 2022-04-23 DIAGNOSIS — L309 Dermatitis, unspecified: Secondary | ICD-10-CM

## 2022-04-23 DIAGNOSIS — H1013 Acute atopic conjunctivitis, bilateral: Secondary | ICD-10-CM

## 2022-04-23 DIAGNOSIS — J310 Chronic rhinitis: Secondary | ICD-10-CM | POA: Diagnosis not present

## 2022-04-23 DIAGNOSIS — H109 Unspecified conjunctivitis: Secondary | ICD-10-CM

## 2022-04-23 DIAGNOSIS — T781XXD Other adverse food reactions, not elsewhere classified, subsequent encounter: Secondary | ICD-10-CM

## 2022-04-23 NOTE — Patient Instructions (Addendum)
Dermatitis Adverse food reaction Non-IgE mediated symptoms  - IgE testing to cinnamon and spicy peppers are negative.  - continue to avoid cinnamon and spicy peppers - for facial rash will try Opzelura ointment twice a day use as needed for itchy/red/dry/patchy/bumpy/scaly/flaky areas.  This is a non-steroid ointment that can be used anywhere on body if needed.  Samples provided - discussed performing patch testing to help determine if there is a contact allergy component to your rash.   Contact dermatitis (contact allergy).  Patch testing is the test of choice to evaluate for contact dermatitis.  Recommend performing patch testing with the TRUE test patch panels.  Patches are best placed on a Monday with return to office on Wednesday and Friday of same week for readings.  Once patches are in place to do not get them wet.  You can take antihistamines while patches are in place.   True Test looks for the following sensitivities:      Environmental allergy - allergy panel was positive to outdoor mold.  Allergen avoidance measures provided - continue Zyrtec 10mg  daily   - for itchy/watery eyes can use Olopatadine eye drop 1 drop each eye daily as needed - for nasal congestin/drainage can use Flonase 2 sprays each nostril daily for 1-2 weeks at a time before stopping once nasal congestion improves for maximum benefit  Eczema - continue daily moisturization and moisturize after bathing   Follow-up in 6 months or sooner if needed

## 2022-04-23 NOTE — Progress Notes (Signed)
Follow-up Note  RE: Courtney Harris MRN: [id number] DOB: 10/24/1985 Date of Office Visit: 04/23/2022   History of present illness: Courtney Harris is a 36 y.o. female presenting today for follow-up of dermatitis.  She was last seen in the office on 03/12/2022 by myself. She states she is still having issues with the facial rash.  It is a bit better.  The steroid injection did help but not completely.  The Elidel was not covered with insurance. So Tacrolimus was the covered alternative which she was able to get from the pharmacy.  She did try this on the facial rash but states did not help.  She then saw her PCP who prescribed Triamcinolone ointment which she has been using over the past week 3 times a day and has helped more than the nonsteroid ointment.  However it has not completely gone away which she would like it to go away completely.  She continues to avoid cinnamon and spicy peppers in her diet.  She is wondering if there is something becoming into contact with her skin that is keeping the rash present.  She is wondering if things like burning candles in the home might be an issue.  She does report a lots of fragrance/perfume use. She does use Zyrtec as needed for any allergy symptoms.  Review of systems: Review of Systems  Constitutional: Negative.   HENT: Negative.    Eyes: Negative.   Respiratory: Negative.    Cardiovascular: Negative.   Gastrointestinal: Negative.   Musculoskeletal: Negative.   Skin:  Positive for rash.  Allergic/Immunologic: Negative.   Neurological: Negative.      All other systems negative unless noted above in HPI  Past medical/social/surgical/family history have been reviewed and are unchanged unless specifically indicated below.  No changes  Medication List: Current Outpatient Medications  Medication Sig Dispense Refill   cetirizine (ZYRTEC) 10 MG tablet Take 1 tablet (10 mg total) by mouth daily as needed for allergies. 30 tablet 5   fluticasone  (FLONASE) 50 MCG/ACT nasal spray Place 2 sprays into both nostrils daily as needed for allergies or rhinitis. 16 g 5   hydrochlorothiazide (HYDRODIURIL) 12.5 MG tablet Take 12.5 mg by mouth daily.     hydrOXYzine (ATARAX) 25 MG tablet Take 25 mg by mouth at bedtime.     metroNIDAZOLE (METROCREAM) 0.75 % cream Apply topically 2 (two) times daily.     norethindrone-ethinyl estradiol-FE (JUNEL FE 1/20) 1-20 MG-MCG tablet Take 1 tablet by mouth daily.     norethindrone-ethinyl estradiol-iron (BLISOVI FE 1.5/30) 1.5-30 MG-MCG tablet TK 1 T PO QD     Olopatadine HCl 0.2 % SOLN Apply 1 drop to eye daily as needed (Itchy, watery eyes). 2.5 mL 5   omeprazole (PRILOSEC) 20 MG capsule Take 20 mg by mouth daily.     pimecrolimus (ELIDEL) 1 % cream Apply topically 2 (two) times daily. 60 g 2   tacrolimus (PROTOPIC) 0.1 % ointment Apply topically 2 (two) times daily. 100 g 0   TRI-LO-MILI 0.18/0.215/0.25 MG-25 MCG tab Take 1 tablet by mouth daily.     triamcinolone cream (KENALOG) 0.1 % Apply topically 3 (three) times daily.     No current facility-administered medications for this visit.     Known medication allergies: Allergies  Allergen Reactions   Fluconazole Hives   Metronidazole Hives    Vaginal cream only (hives were on inner thighs only) (no issues with pills)      Physical examination: Blood pressure 128/70, pulse  73, temperature 98.1 F (36.7 C), temperature source Temporal, resp. rate 18, SpO2 100 %.  General: Alert, interactive, in no acute distress. HEENT: PERRLA, TMs pearly gray, turbinates non-edematous without discharge, post-pharynx non erythematous. Neck: Supple without lymphadenopathy. Lungs: Clear to auscultation without wheezing, rhonchi or rales. {no increased work of breathing. CV: Normal S1, S2 without murmurs. Abdomen: Nondistended, nontender. Skin: Very faint small area of erythema on the chin . Extremities:  No clubbing, cyanosis or edema. Neuro:   Grossly  intact.  Diagnositics/Labs: Labs:  Component     Latest Ref Rng 03/12/2022  IgE (Immunoglobulin E), Serum     6 - 495 IU/mL 45   D Pteronyssinus IgE     Class 0 kU/L <0.10   D Farinae IgE     Class 0 kU/L <0.10   Cat Dander IgE     Class 0 kU/L <0.10   Dog Dander IgE     Class 0 kU/L <0.10   [place] Grass IgE     Class 0 kU/L <0.10   Timothy Grass IgE     Class 0 kU/L <0.10   [place] Grass IgE     Class 0 kU/L <0.10   Cockroach, German IgE     Class 0 kU/L <0.10   Penicillium Chrysogen IgE     Class 0 kU/L <0.10   Cladosporium Herbarum IgE     Class 0 kU/L <0.10   Aspergillus Fumigatus IgE     Class 0 kU/L <0.10   Alternaria Alternata IgE     Class I kU/L 0.45 !   Maple/Box Elder IgE     Class 0 kU/L <0.10   Common Silver Charletta Cousin IgE     Class 0 kU/L <0.10   Cedar, [place] IgE     Class 0 kU/L <0.10   Oak, White IgE     Class 0 kU/L <0.10   Elm, American IgE     Class 0 kU/L <0.10   C[place] IgE     Class 0 kU/L <0.10   Pecan, Hickory IgE     Class 0 kU/L <0.10   White Mulberry IgE     Class 0 kU/L <0.10   Ragweed, Short IgE     Class 0 kU/L <0.10   Pigweed, Rough IgE     Class 0 kU/L <0.10   Sheep Sorrel IgE Qn     Class 0 kU/L <0.10   Mouse Urine IgE     Class 0 kU/L <0.10   Jalapeno Pepper     <0.35 kU/L <0.35   Class Interpretation 0   Allergen Cinnamon IgE     Class 0 kU/L <0.10   [id number] Chili Pepper     Class 0 kU/L <0.10     Assessment and plan:   Dermatitis Adverse food reaction Non-IgE mediated symptoms  - IgE testing to cinnamon and spicy peppers are negative.  - continue to avoid cinnamon and spicy peppers - for facial rash will try Opzelura ointment twice a day use as needed for itchy/red/dry/patchy/bumpy/scaly/flaky areas.  This is a non-steroid ointment that can be used anywhere on body if needed.  Samples provided - discussed performing patch testing to help determine if there is a contact allergy component to your rash.    Contact dermatitis (contact allergy).  Patch testing is the test of choice to evaluate for contact dermatitis.  Recommend performing patch testing with the TRUE test patch panels.  Patches are best placed on a Monday with return to  office on Wednesday and Friday of same week for readings.  Once patches are in place to do not get them wet.  You can take antihistamines while patches are in place.   Environmental allergy - allergy panel was positive to outdoor mold.  Allergen avoidance measures provided - continue Zyrtec 10mg  daily   - for itchy/watery eyes can use Olopatadine eye drop 1 drop each eye daily as needed - for nasal congestin/drainage can use Flonase 2 sprays each nostril daily for 1-2 weeks at a time before stopping once nasal congestion improves for maximum benefit  Eczema - continue daily moisturization and moisturize after bathing  Follow-up in 6 months or sooner if needed  I appreciate the opportunity to take part in Aylene's care. Please do not hesitate to contact me with questions.  Sincerely,   , MD Allergy/Immunology [facility]

## 2022-05-05 ENCOUNTER — Ambulatory Visit: Payer: Medicare Other | Admitting: Internal Medicine

## 2022-05-07 ENCOUNTER — Encounter: Payer: Medicare Other | Admitting: Internal Medicine

## 2022-05-09 ENCOUNTER — Encounter: Payer: Medicare Other | Admitting: Internal Medicine

## 2022-05-09 DIAGNOSIS — Z01411 Encounter for gynecological examination (general) (routine) with abnormal findings: Secondary | ICD-10-CM | POA: Diagnosis not present

## 2022-05-09 DIAGNOSIS — Z8742 Personal history of other diseases of the female genital tract: Secondary | ICD-10-CM | POA: Diagnosis not present

## 2022-05-09 DIAGNOSIS — Z87410 Personal history of cervical dysplasia: Secondary | ICD-10-CM | POA: Diagnosis not present

## 2022-05-09 DIAGNOSIS — N898 Other specified noninflammatory disorders of vagina: Secondary | ICD-10-CM | POA: Diagnosis not present

## 2022-05-09 DIAGNOSIS — Z124 Encounter for screening for malignant neoplasm of cervix: Secondary | ICD-10-CM | POA: Diagnosis not present

## 2022-05-09 DIAGNOSIS — N760 Acute vaginitis: Secondary | ICD-10-CM | POA: Diagnosis not present

## 2022-05-09 DIAGNOSIS — Z1151 Encounter for screening for human papillomavirus (HPV): Secondary | ICD-10-CM | POA: Diagnosis not present

## 2022-05-09 DIAGNOSIS — B9689 Other specified bacterial agents as the cause of diseases classified elsewhere: Secondary | ICD-10-CM | POA: Diagnosis not present

## 2022-05-09 DIAGNOSIS — Z872 Personal history of diseases of the skin and subcutaneous tissue: Secondary | ICD-10-CM | POA: Diagnosis not present

## 2022-05-12 DIAGNOSIS — L710 Perioral dermatitis: Secondary | ICD-10-CM | POA: Diagnosis not present

## 2022-06-24 DIAGNOSIS — E663 Overweight: Secondary | ICD-10-CM | POA: Diagnosis not present

## 2022-06-24 DIAGNOSIS — L218 Other seborrheic dermatitis: Secondary | ICD-10-CM | POA: Diagnosis not present

## 2022-07-08 DIAGNOSIS — E663 Overweight: Secondary | ICD-10-CM | POA: Diagnosis not present

## 2022-07-08 DIAGNOSIS — L298 Other pruritus: Secondary | ICD-10-CM | POA: Diagnosis not present

## 2022-07-09 DIAGNOSIS — K219 Gastro-esophageal reflux disease without esophagitis: Secondary | ICD-10-CM | POA: Diagnosis not present

## 2022-07-09 DIAGNOSIS — E559 Vitamin D deficiency, unspecified: Secondary | ICD-10-CM | POA: Diagnosis not present

## 2022-07-09 DIAGNOSIS — E782 Mixed hyperlipidemia: Secondary | ICD-10-CM | POA: Diagnosis not present

## 2022-07-09 DIAGNOSIS — I10 Essential (primary) hypertension: Secondary | ICD-10-CM | POA: Diagnosis not present

## 2022-07-09 DIAGNOSIS — N946 Dysmenorrhea, unspecified: Secondary | ICD-10-CM | POA: Diagnosis not present

## 2022-07-09 DIAGNOSIS — J302 Other seasonal allergic rhinitis: Secondary | ICD-10-CM | POA: Diagnosis not present

## 2022-07-14 ENCOUNTER — Ambulatory Visit: Payer: Medicare Other | Admitting: Internal Medicine

## 2022-08-21 DIAGNOSIS — Z23 Encounter for immunization: Secondary | ICD-10-CM | POA: Diagnosis not present

## 2022-08-21 DIAGNOSIS — E663 Overweight: Secondary | ICD-10-CM | POA: Diagnosis not present

## 2022-08-21 DIAGNOSIS — L2089 Other atopic dermatitis: Secondary | ICD-10-CM | POA: Diagnosis not present

## 2022-09-04 ENCOUNTER — Other Ambulatory Visit: Payer: Self-pay | Admitting: Allergy

## 2022-09-04 DIAGNOSIS — L2089 Other atopic dermatitis: Secondary | ICD-10-CM | POA: Diagnosis not present

## 2022-09-25 DIAGNOSIS — L2089 Other atopic dermatitis: Secondary | ICD-10-CM | POA: Diagnosis not present

## 2022-09-25 DIAGNOSIS — L282 Other prurigo: Secondary | ICD-10-CM | POA: Diagnosis not present

## 2023-12-15 ENCOUNTER — Ambulatory Visit (INDEPENDENT_AMBULATORY_CARE_PROVIDER_SITE_OTHER): Admitting: Internal Medicine

## 2023-12-15 ENCOUNTER — Other Ambulatory Visit: Payer: Self-pay

## 2023-12-15 VITALS — BP 126/82 | HR 72 | Temp 97.9°F | Resp 18 | Wt 222.2 lb

## 2023-12-15 DIAGNOSIS — H109 Unspecified conjunctivitis: Secondary | ICD-10-CM

## 2023-12-15 DIAGNOSIS — L719 Rosacea, unspecified: Secondary | ICD-10-CM

## 2023-12-15 DIAGNOSIS — L2389 Allergic contact dermatitis due to other agents: Secondary | ICD-10-CM | POA: Diagnosis not present

## 2023-12-15 DIAGNOSIS — J310 Chronic rhinitis: Secondary | ICD-10-CM

## 2023-12-15 DIAGNOSIS — T781XXD Other adverse food reactions, not elsewhere classified, subsequent encounter: Secondary | ICD-10-CM | POA: Diagnosis not present

## 2023-12-15 DIAGNOSIS — H1013 Acute atopic conjunctivitis, bilateral: Secondary | ICD-10-CM

## 2023-12-15 NOTE — Progress Notes (Signed)
 FOLLOW UP Date of Service/Encounter:  12/15/23  Subjective:  Courtney Harris (DOB: 1985/11/27) is a 38 y.o. female who [facility] in re-evaluation of the following: dermatitis  History obtained from: chart review and patient.  For Review, LV was on 04/23/22  with Dr. Delorse Lek seen for routine follow-up. See below for summary of history and diagnostics.    Today presents for follow-up. Discussed the use of AI scribe software for clinical note transcription with the patient, who gave verbal consent to proceed.  History of Present Illness Courtney Harris is a 38 year old female who presents with recurrent facial rashes.  She has experienced recurrent facial rashes for approximately two years, primarily occurring under her chin and sometimes under her eyes, presenting as small bumps. The rash is intermittent and exacerbated by sun exposure.  She has been under dermatological care and initially received oral steroids, which led to hives and facial redness, prompting discontinuation of prednisone. A subsequent dermatologist prescribed tacrolimus, a non-steroidal cream, which has been the most effective treatment. Discontinuation of tacrolimus results in a single red bump that resolves with face washing.  She has a history of using various topical treatments, including triamcinolone and Elidel, but finds tacrolimus to be the most effective. Hydrocortisone cream worsened her condition. She has not experienced significant improvement with other treatments and is concerned about the redness affecting her complexion.  She avoids alcohol due to fear of worsening symptoms, but caffeine, specifically tea, causes increased redness. She denies using hair dye and uses hypoallergenic products for face washing. Symptoms due worsen with sun exposure.  She does not wear SPF.  Per chart she carries a diagnosis of rosacea from dermatology but voices confusion over this  diagnosis.    She was previously evaluated for possible allergic reactions to cinnamon and spicy peppers, but allergy testing was negative. Despite this, she was advised to avoid these foods due to potential irritant reactions. Environmental allergy testing was also negative.     All medications reviewed by clinical staff and updated in chart. No new pertinent medical or surgical history except as noted in HPI.  ROS: All others negative except as noted per HPI.   Objective:  BP 126/82   Pulse 72   Temp 97.9 F (36.6 C) (Temporal)   Resp 18   Wt 222 lb 3.2 oz (100.8 kg)   SpO2 100%   BMI 31.88 kg/m  Body mass index is 31.88 kg/m. Physical Exam: General Appearance:  Alert, cooperative, no distress, appears stated age  Head:  Normocephalic, without obvious abnormality, atraumatic  Eyes:  Conjunctiva clear, EOM's intact  Ears EACs normal bilaterally  Nose: Nares normal,     Throat: Lips, tongue normal; teeth and gums normal,     Neck: Supple, symmetrical  Lungs:     , Respirations unlabored, no coughing  Heart:    , Appears well perfused  Extremities: No edema  Skin: Scattered telangiectasias on nose, erythematous papules around nose , Skin color, texture, turgor normal, and no rashes or lesions on visualized portions of skin  Neurologic: No gross deficits   Labs:  Lab Orders  No laboratory test(s) ordered today     Assessment/Plan   Assessment and Plan Assessment & Plan Rosacea Facial redness, papules, and telangiectasias consistent with rosacea. Symptoms exacerbated by sun, caffeine, and alcohol. Negative IgE for cinnamon and spicy peppers. Tacrolimus effective but redness and bumps recur without it. Concerns about redness and scarring, especially  in skin of color. Management includes trigger avoidance and treatment. - Advise daily sunscreen use with SPF. - Avoid alcohol and caffeine. - Continue tacrolimus. - Refer to [facility] for second opinion, especially  for skin of color. - Discuss potential laser therapy for telangiectasias with dermatology. - Consider patch testing for contact dermatitis.  Contact Dermatitis Negative allergy testing for cinnamon and spicy peppers, but possible contact dermatitis from these substances. Advised avoidance due to non-IgE mediated reactions. - Consider patch testing for contact allergens.    Other:     Thank you so much for letting me partake in your care today.  Don't hesitate to reach out if you have any additional concerns!  Ferol Luz, MD  [facility] [place]

## 2023-12-15 NOTE — Patient Instructions (Signed)
 Rosacea Facial redness, papules, and telangiectasias consistent with rosacea. Symptoms exacerbated by sun, caffeine, and alcohol. Negative IgE for cinnamon and spicy peppers. Tacrolimus effective but redness and bumps recur without it. Concerns about redness and scarring, especially in skin of color. Management includes trigger avoidance and treatment. - Advise daily sunscreen use with SPF. - Avoid alcohol and caffeine. - Continue tacrolimus. - Refer to [facility]  - Discuss potential laser therapy for telangiectasias with dermatology. - Consider patch testing for contact dermatitis.  Contact Dermatitis Negative allergy testing for cinnamon and spicy peppers, but possible contact dermatitis from these substances. Advised avoidance due to non-IgE mediated reactions.  Patches are best placed on Monday with return to office on Wednesday and Friday of same week for readings.  Patches once placed should not get wet.  You do not have to stop any medications for patch testing but should not be on oral prednisone. You can schedule a patch testing visit when convenient for your schedule.    Follow up: patch testing   Thank you so much for letting me partake in your care today.  Don't hesitate to reach out if you have any additional concerns!  Ferol Luz, MD  [facility] [place], [place]

## 2023-12-28 ENCOUNTER — Ambulatory Visit: Admitting: Family

## 2023-12-30 ENCOUNTER — Encounter: Admitting: Internal Medicine

## 2024-01-01 ENCOUNTER — Encounter: Admitting: Family

## 2024-04-04 ENCOUNTER — Telehealth: Payer: Self-pay | Admitting: Internal Medicine

## 2024-04-04 NOTE — Telephone Encounter (Signed)
 Pt request a call back about her allergy test

## 2024-04-06 NOTE — Telephone Encounter (Signed)
 Called patient, she wanted to know what allergy/food test was done in 2023. Advised her of test. She wanted to make an appointment to get the whole food panel done. Appointment made.

## 2024-04-06 NOTE — Telephone Encounter (Signed)
 Last message from Dr Jeneal labs only 03/12/2022 Please let patient know the following:  -Environmental allergy panel shows low IgE to outdoor mold.  Please provide with avoidance measures  -IgE to cinnamon, jalapeno, chili pepper are negative.   Would recommend skin testing to these foods to see if she will be eligible for an office food challenge.

## 2024-04-13 ENCOUNTER — Ambulatory Visit: Admitting: Internal Medicine
